# Patient Record
Sex: Male | Born: 1980 | Race: White | Hispanic: No | Marital: Married
Health system: Southern US, Community
[De-identification: ages and names within clinical notes are randomized; demographics above are authoritative.]

---

## 2017-03-14 ENCOUNTER — Ambulatory Visit: Payer: Self-pay | Admitting: *Deleted

## 2017-03-14 NOTE — Telephone Encounter (Signed)
Pt   Had  A  Fainting  Spell  Yesterday  [facility]   [facility] . Pt  Reports  Feels  Better   Today  - he is  Concerned  About  A  Yellow  Drainage  That was  Noted  From his  Nose  Prior to the  Episode. Denies  Any  specefic  Injury . Pt  Advised  To  Proceed  To [facility].Directions  Given. He  Is  Not  Established  Yet  In [place]  He  Has  A  New  Patient appt   In June  To be  Established  With Karlyne GreenspanAsheigh   [place]    Reason for Disposition . [1] All other patients AND [2] now alert and feels fine(Exception: SIMPLE FAINT due to stress, pain, prolonged standing, or suddenly standing)  Answer Assessment - Initial Assessment Questions 1. ONSET: "How long were you unconscious?" (minutes) "When did it happen?"      Was  Incoherent  For  A  Few  Minutes    2. CONTENT: "What happened during period of unconsciousness?" (e.g., seizure activity)       Pt  Was   Working    Noticed  [employer]  Dripping on the  Floor  Pt started  To  Feel  Dizzy  And  Sat  Down -  He  Then felt   Numb  Difficulty   Seen by  A  Pa  Yesterday   In [place]     Yesterday  And  Was  Told  By  A  Pa   Yesterday   That  Everything  Was  Ok    3. MENTAL STATUS: "Alert and oriented now?" (oriented x 3 = name, month, location)       Pt   Feels ok    4. TRIGGER: "What do you think caused the fainting?" "What were you doing just before you fainted?"  (e.g., exercise, sudden standing up, prolonged standing)       No    5. RECURRENT SYMPTOM: "Have you ever passed out before?" If so, ask: "When was the last time?" and "What happened that time?"        No   6. INJURY: "Did you sustain any injury during the fall?"        No 7. CARDIAC SYMPTOMS: "Have you had any of the following symptoms: chest pain, difficulty breathing, palpitations?"       No 8. NEUROLOGIC SYMPTOMS: "Have you had any of the following symptoms: headache, numbness, vertigo, weakness?"       Yest   Was   Was  Weak  And  Had  A   Headache  Better  After  Motrin     9. GI SYMPTOMS: "Have you had any of the following symptoms: abdominal pain, vomiting, diarrhea, blood in stools?"        No 10. OTHER SYMPTOMS: "Do you have any other symptoms?"        Just the  Event of  Yesterday   11. PREGNANCY: "Is there any chance you are pregnant?" "When was your last menstrual period?"       n/a  Protocols used: [facility]

## 2017-06-15 ENCOUNTER — Ambulatory Visit: Payer: Self-pay | Admitting: Nurse Practitioner

## 2017-09-22 ENCOUNTER — Emergency Department (INDEPENDENT_AMBULATORY_CARE_PROVIDER_SITE_OTHER): Payer: BLUE CROSS/BLUE SHIELD

## 2017-09-22 ENCOUNTER — Other Ambulatory Visit: Payer: Self-pay

## 2017-09-22 ENCOUNTER — Emergency Department (INDEPENDENT_AMBULATORY_CARE_PROVIDER_SITE_OTHER)
Admission: EM | Admit: 2017-09-22 | Discharge: 2017-09-22 | Disposition: A | Discharge status: Home / Self Care | Payer: BLUE CROSS/BLUE SHIELD | Source: Home / Self Care | Attending: Emergency Medicine | Admitting: Emergency Medicine

## 2017-09-22 ENCOUNTER — Encounter: Payer: Self-pay | Admitting: Emergency Medicine

## 2017-09-22 DIAGNOSIS — R0902 Hypoxemia: Secondary | ICD-10-CM

## 2017-09-22 DIAGNOSIS — J189 Pneumonia, unspecified organism: Secondary | ICD-10-CM | POA: Diagnosis not present

## 2017-09-22 DIAGNOSIS — J181 Lobar pneumonia, unspecified organism: Secondary | ICD-10-CM

## 2017-09-22 LAB — POCT CBC W AUTO DIFF (K'VILLE URGENT CARE)

## 2017-09-22 LAB — POCT INFLUENZA A/B
Influenza A, POC: NEGATIVE
Influenza B, POC: NEGATIVE

## 2017-09-22 MED ORDER — IPRATROPIUM-ALBUTEROL 0.5-2.5 (3) MG/3ML IN SOLN
3.0000 mL | Freq: Four times a day (QID) | RESPIRATORY_TRACT | Status: DC
  Administered 2017-09-22 (×2): 3 mL via RESPIRATORY_TRACT

## 2017-09-22 NOTE — ED Provider Notes (Addendum)
[facility]    CSN: [id number] Arrival date & time: 09/22/17  1129     History   Chief Complaint Chief Complaint  Patient presents with  . Shortness of Breath  . Pneumonia    community aquired    HPI George Brennan is a 37 y.o. male.  Patient here for follow-up of pneumonia. He was seen at an urgent care center on Wednesday with a 3 to four-day history of fevers chills and myalgias. He was diagnosed with community-acquired pneumonia. He was started on Levaquin 500 milligrams 1 a day and has had 3 doses. He has a persistent cough productive of thick phlegm. He has shortness of breath with minimal exertion. He has not had significant pain with his pneumonia. He feels  He has having difficulty with his breathing with discomfort and inability to take a full breath or breathing out well.Patient was traveling over Labor Day weekend and was in a plane at that time 4 days prior to his illness. HPI  History reviewed. No pertinent past medical history.  There are no active problems to display for this patient.   History reviewed. No pertinent surgical history.     Home Medications    Prior to Admission medications   Medication Sig Start Date End Date Taking? Authorizing Provider  benzonatate (TESSALON) 100 MG capsule Take by mouth 3 (three) times daily as needed for cough.   Yes [provider]  HYDROcodone-homatropine (HYCODAN) 5-1.5 MG/5ML syrup Take 5 mLs by mouth every 6 (six) hours as needed for cough.   Yes [provider]  levofloxacin (LEVAQUIN) 500 MG tablet Take 500 mg by mouth daily.   Yes [provider]    Family History No family history on file.  Social History Social History   Tobacco Use  . Smoking status: Not on file  Substance Use Topics  . Alcohol use: Not on file  . Drug use: Not on file     Allergies   Patient has no known allergies.   Review of Systems Review of Systems  Constitutional: Positive for  chills, fatigue and fever.  HENT: Negative.   Eyes: Negative.   Respiratory: Positive for cough, chest tightness, shortness of breath and wheezing.   Cardiovascular: Negative.   Gastrointestinal: Negative.      Physical Exam Triage Vital Signs ED Triage Vitals  Enc Vitals Group     BP 09/22/17 1254 121/84     Pulse Rate 09/22/17 1254 (!) 108     Resp 09/22/17 1254 20     Temp 09/22/17 1254 98.2 F (36.8 C)     Temp Source 09/22/17 1254 Oral     SpO2 09/22/17 1254 94 %     Weight 09/22/17 1255 206 lb (93.4 kg)     Height 09/22/17 1255 6' (1.829 m)     Head Circumference --      Peak Flow --      Pain Score 09/22/17 1255 0     Pain Loc --      Pain Edu? --      Excl. in GC? --    No data found.  Updated Vital Signs BP 121/84 (BP Location: Right Arm)   Pulse (!) 108   Temp 98.2 F (36.8 C) (Oral)   Resp 20   Ht 6' (1.829 m)   Wt 93.4 kg   SpO2 94%   BMI 27.94 kg/m   Visual Acuity Right Eye Distance:   Left Eye Distance:  Bilateral Distance:    Right Eye Near:   Left Eye Near:    Bilateral Near:     Physical Exam  Constitutional: He appears well-developed and well-nourished. He appears ill.  HENT:  Mouth/Throat: Oropharynx is clear and moist.  Neck: Normal range of motion. Neck supple.  Cardiovascular: Normal rate and regular rhythm.  Pulmonary/Chest:  Breath sounds are symetrical. There are rales present in the left upper and lower lobe with left lower lobe rub.Marland Kitchen.     UC Treatments / Results  Labs (all labs ordered are listed, but only abnormal results are displayed) Labs Reviewed  POCT CBC W AUTO DIFF ([facility])  POCT INFLUENZA A/B  white count 5000. 52% granulocytes. Hemoglobin 16.9  384,000 Platelets  EKG None  Radiology Dg Chest 2 View  Result Date: 09/22/2017 CLINICAL DATA:  Cough and shortness of breath with chest heaviness 10 days. Recent pneumonia. EXAM: CHEST - 2 VIEW COMPARISON:  None. FINDINGS: Lungs are adequately  inflated with airspace opacification over the posterior left lower lobe likely persistent pneumonia. No effusion. Cardiomediastinal silhouette and remainder of the exam is unremarkable. IMPRESSION: Left lower lobe airspace process likely persistent pneumonia. Electronically Signed   By: Elberta Fortisaniel  Boyle M.D.   On: 09/22/2017 14:09    Procedures Procedures (including critical care time)  Medications Ordered in UC Medications  ipratropium-albuterol (DUONEB) 0.5-2.5 (3) MG/3ML nebulizer solution 3 mL (3 mLs Nebulization Given 09/22/17 1310)    Initial Impression / Assessment and Plan / UC Course  I have reviewed the triage vital signs and the nursing notes.  Pertinent labs & imaging results that were available during my care of the patient were reviewed by me and considered in my medical decision making (see chart for details).  patient has left lower lobe pneumonia. He has not improved on Levaquin. I advised him to go to the hospital for further evaluation and management due to his hypoxia persistent infiltrate and failure to improve on Levaquin. He is also at risk for clot due to his recent air travel. [facility] and they will evaluate the patient.    Thank you Final Clinical Impressions(s) / UC Diagnoses   Final diagnoses:  Hypoxia  Community acquired pneumonia of left lower lobe of lung [place](HCC)     Discharge Instructions     Please go to the hospital for further evaluation. I have called the triage nurse regarding your arrival.    ED Prescriptions    None     Controlled Substance Prescriptions [place] Controlled Substance Registry consulted? Not Applicable   Collene Gobbleaub, Marlowe Cinquemani A, MD 09/22/17 1459    Collene Gobbleaub, Kerry-Anne Mezo A, MD 09/22/17 1500

## 2017-09-22 NOTE — Discharge Instructions (Signed)
Please go to the hospital for further evaluation. I have called the triage nurse regarding your arrival.

## 2017-09-22 NOTE — ED Triage Notes (Signed)
Patient had cough and fever since 8 days ago; went to an urgent care 4 days ago and was diagnosed with community aquired pneumonia; placed on levoquin, tessalon, hydrocodone cough syrup. Now is experiencing shortness of breath with frequent cough.

## 2017-09-23 ENCOUNTER — Telehealth: Payer: Self-pay | Admitting: Emergency Medicine

## 2017-09-23 NOTE — Telephone Encounter (Signed)
Left VM to call us with a status report.

## 2020-04-28 IMAGING — DX DG CHEST 2V
2 series · 2 of 2 positions shown · non-contrast
Comparison: None.

CLINICAL DATA: Cough and shortness of breath with chest heaviness
10 days. Recent pneumonia.

EXAM:
CHEST - 2 VIEW

[chest pa]
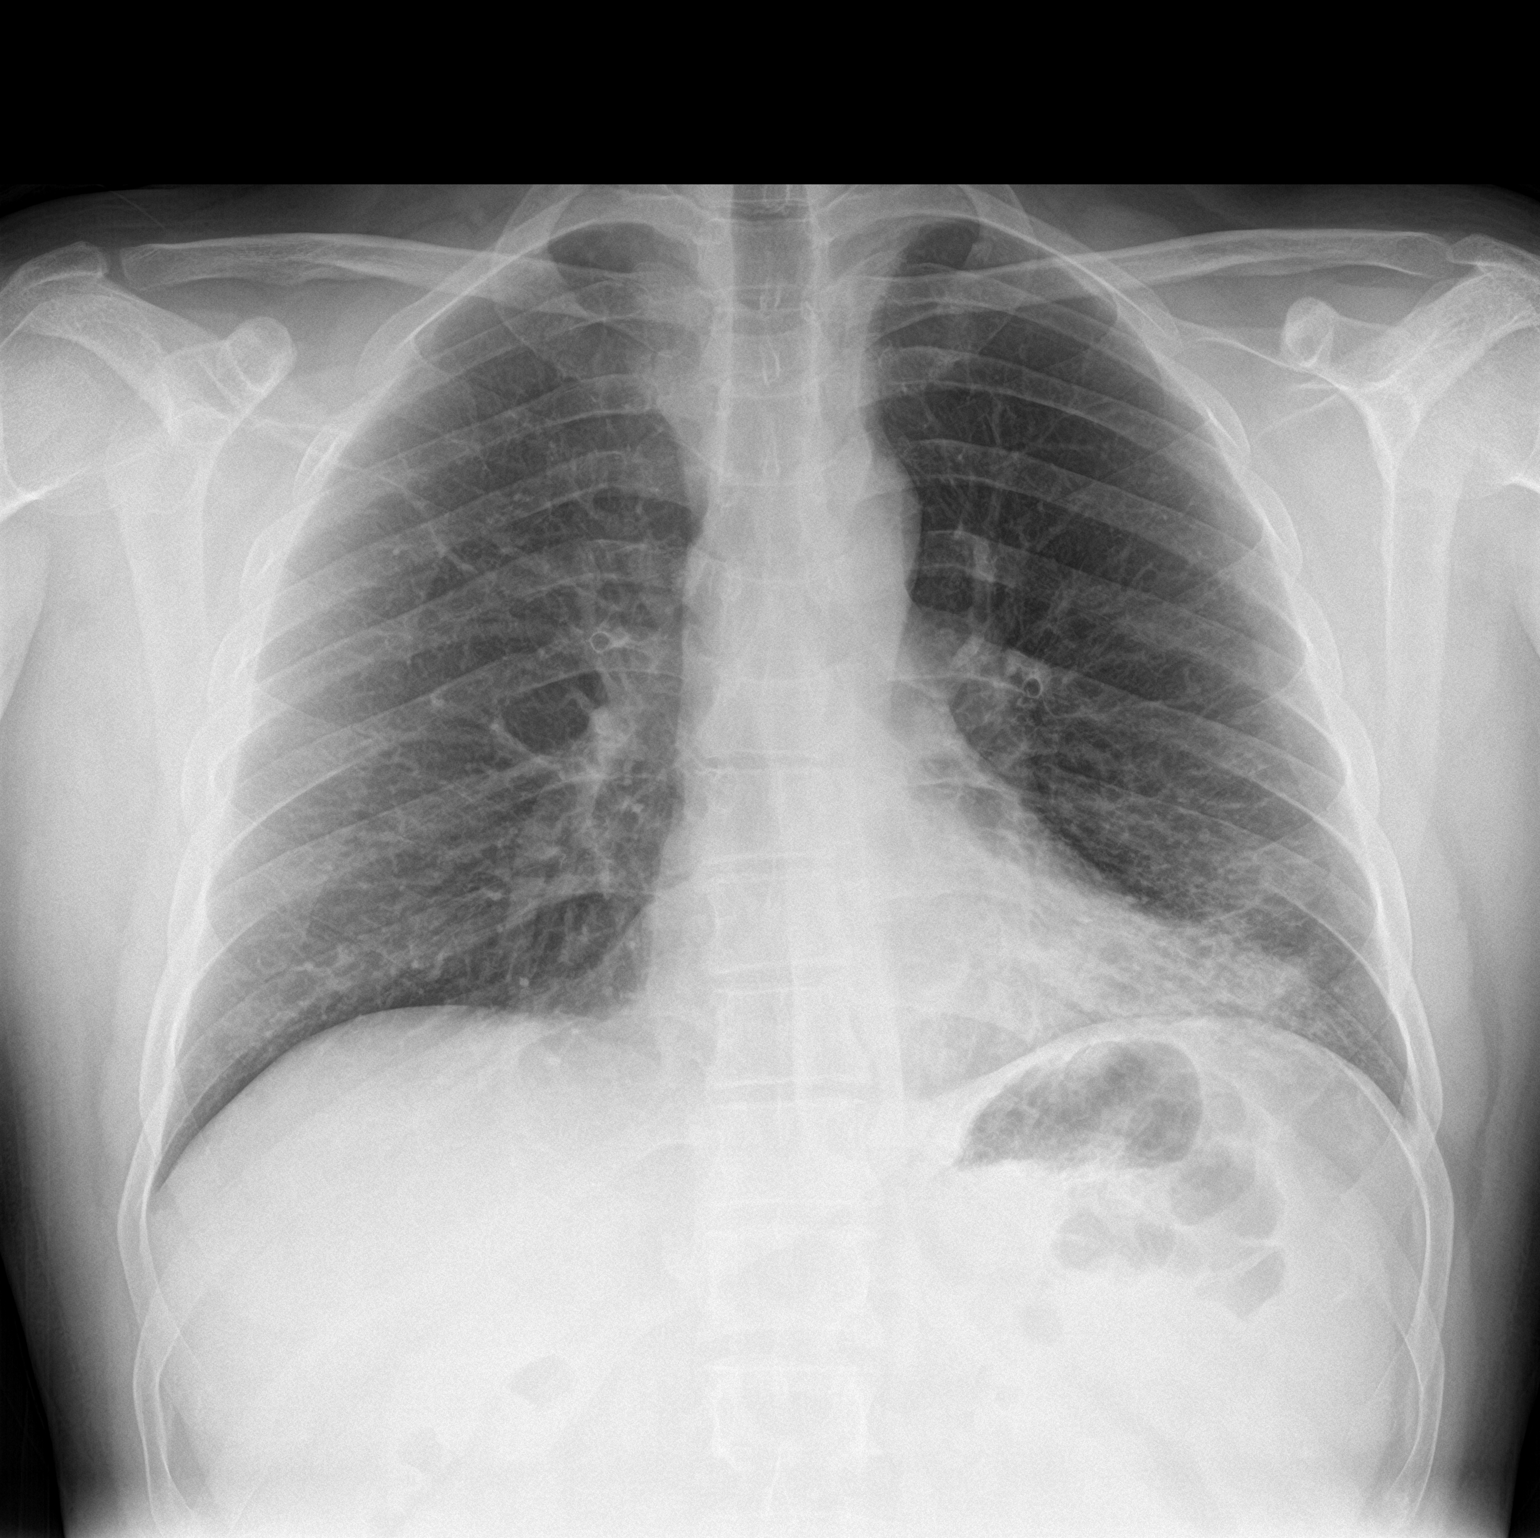

[chest lat]
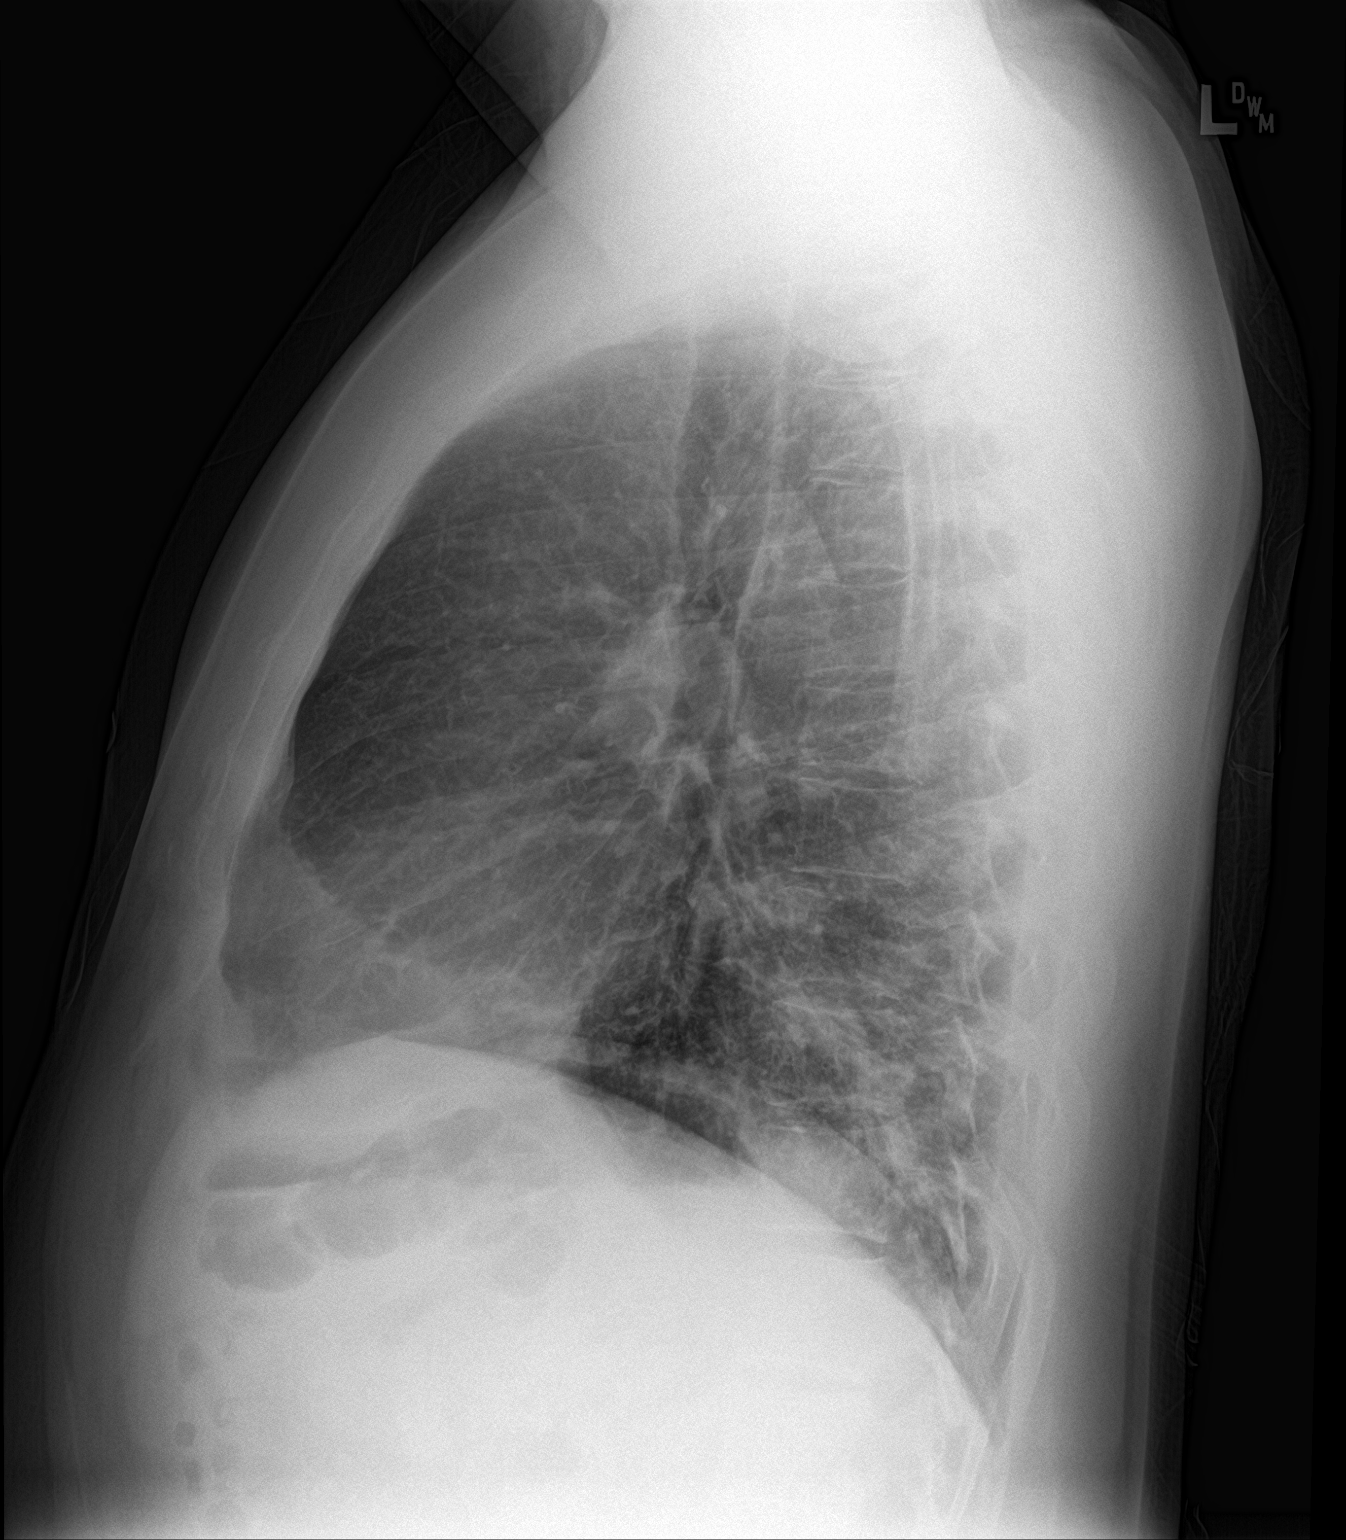

[2 of 2 positions shown; findings below may reference images not displayed]

FINDINGS: Lungs are adequately inflated with airspace opacification over the
posterior left lower lobe likely persistent pneumonia. No effusion.
Cardiomediastinal silhouette and remainder of the exam is
unremarkable.
IMPRESSION: Left lower lobe airspace process likely persistent pneumonia.
